# Patient Record
Sex: Female | Born: 1995 | Hispanic: No | Marital: Single | Smoking: Current every day smoker
Health system: Southern US, Community
[De-identification: ages and names within clinical notes are randomized; demographics above are authoritative.]

## PROBLEM LIST (undated history)

## (undated) DIAGNOSIS — R1110 Vomiting, unspecified: Secondary | ICD-10-CM

## (undated) DIAGNOSIS — R51 Headache: Secondary | ICD-10-CM

## (undated) DIAGNOSIS — R1012 Left upper quadrant pain: Secondary | ICD-10-CM

## (undated) DIAGNOSIS — K920 Hematemesis: Secondary | ICD-10-CM

## (undated) HISTORY — DX: Headache: R51

## (undated) HISTORY — DX: Hematemesis: K92.0

## (undated) HISTORY — DX: Left upper quadrant pain: R10.12

## (undated) HISTORY — DX: Vomiting, unspecified: R11.10

---
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
## 2015-07-16 ENCOUNTER — Emergency Department: Payer: Medicaid Other

## 2015-07-16 ENCOUNTER — Emergency Department
Admission: EM | Admit: 2015-07-16 | Discharge: 2015-07-16 | Disposition: A | Discharge status: Home / Self Care | Payer: Medicaid Other | Attending: Emergency Medicine | Admitting: Emergency Medicine

## 2015-07-16 ENCOUNTER — Encounter: Payer: Self-pay | Admitting: *Deleted

## 2015-07-16 DIAGNOSIS — M545 Low back pain: Secondary | ICD-10-CM | POA: Diagnosis not present

## 2015-07-16 DIAGNOSIS — R1032 Left lower quadrant pain: Secondary | ICD-10-CM | POA: Insufficient documentation

## 2015-07-16 DIAGNOSIS — F17200 Nicotine dependence, unspecified, uncomplicated: Secondary | ICD-10-CM | POA: Insufficient documentation

## 2015-07-16 DIAGNOSIS — Z79899 Other long term (current) drug therapy: Secondary | ICD-10-CM | POA: Diagnosis not present

## 2015-07-16 DIAGNOSIS — Z3202 Encounter for pregnancy test, result negative: Secondary | ICD-10-CM | POA: Insufficient documentation

## 2015-07-16 LAB — WET PREP, GENITAL
CLUE CELLS WET PREP: NONE SEEN
SPERM: NONE SEEN
Trich, Wet Prep: NONE SEEN
Yeast Wet Prep HPF POC: NONE SEEN

## 2015-07-16 LAB — URINALYSIS COMPLETE WITH MICROSCOPIC (ARMC ONLY)
BACTERIA UA: NONE SEEN
Bilirubin Urine: NEGATIVE
Glucose, UA: NEGATIVE mg/dL
Hgb urine dipstick: NEGATIVE
KETONES UR: NEGATIVE mg/dL
Leukocytes, UA: NEGATIVE
Nitrite: NEGATIVE
PROTEIN: NEGATIVE mg/dL
Specific Gravity, Urine: 1.01 (ref 1.005–1.030)
pH: 7 (ref 5.0–8.0)

## 2015-07-16 LAB — CBC
HCT: 43.2 % (ref 35.0–47.0)
HEMOGLOBIN: 14.7 g/dL (ref 12.0–16.0)
MCH: 31.3 pg (ref 26.0–34.0)
MCHC: 34.1 g/dL (ref 32.0–36.0)
MCV: 91.8 fL (ref 80.0–100.0)
Platelets: 255 10*3/uL (ref 150–440)
RBC: 4.7 MIL/uL (ref 3.80–5.20)
RDW: 12.7 % (ref 11.5–14.5)
WBC: 7.7 10*3/uL (ref 3.6–11.0)

## 2015-07-16 LAB — BASIC METABOLIC PANEL
ANION GAP: 5 (ref 5–15)
BUN: 8 mg/dL (ref 6–20)
CALCIUM: 9.6 mg/dL (ref 8.9–10.3)
CO2: 30 mmol/L (ref 22–32)
Chloride: 105 mmol/L (ref 101–111)
Creatinine, Ser: 0.64 mg/dL (ref 0.44–1.00)
GFR calc Af Amer: >60 mL/min (ref >60)
GFR calc non Af Amer: >60 mL/min (ref >60)
GLUCOSE: 88 mg/dL (ref 65–99)
Potassium: 3.9 mmol/L (ref 3.5–5.1)
Sodium: 140 mmol/L (ref 135–145)

## 2015-07-16 LAB — CHLAMYDIA/NGC RT PCR (ARMC ONLY)
CHLAMYDIA TR: NOT DETECTED
N gonorrhoeae: NOT DETECTED

## 2015-07-16 LAB — POCT PREGNANCY, URINE: PREG TEST UR: NEGATIVE

## 2015-07-16 MED ORDER — ONDANSETRON 4 MG PO TBDP
ORAL_TABLET | ORAL | Status: AC
  Administered 2015-07-16: 4 mg via ORAL
  Filled 2015-07-16: qty 1

## 2015-07-16 MED ORDER — OXYCODONE-ACETAMINOPHEN 5-325 MG PO TABS
2.0000 | ORAL_TABLET | Freq: Once | ORAL | Status: AC
  Administered 2015-07-16: 2 via ORAL

## 2015-07-16 MED ORDER — OXYCODONE-ACETAMINOPHEN 5-325 MG PO TABS: 1.0000 | ORAL_TABLET | Freq: Four times a day (QID) | ORAL | Status: AC | PRN

## 2015-07-16 MED ORDER — OXYCODONE-ACETAMINOPHEN 5-325 MG PO TABS
ORAL_TABLET | ORAL | Status: AC
  Filled 2015-07-16: qty 2

## 2015-07-16 MED ORDER — ONDANSETRON 4 MG PO TBDP
4.0000 mg | ORAL_TABLET | Freq: Once | ORAL | Status: AC
  Administered 2015-07-16: 4 mg via ORAL

## 2015-07-16 NOTE — ED Provider Notes (Signed)
[facility] Emergency Department Provider Note  Time seen: 3:34 PM  I have reviewed the triage vital signs and the nursing notes.   HISTORY  Chief Complaint Flank Pain    HPI Teresa Wolfe is a 19 y.o. female with a past medical history of headaches, vomiting, who presents the emergency department with left-sided abdominal pain. According to the patient for the past 1 week she's had left lower quadrant pain. She also states lower back pain with states has been ongoing for greater than 1 month. Denies any dysuria, vaginal bleeding, discharge, nausea, vomiting, diarrhea. Describes the pain as moderate to severe, located mostly in the left lower quadrant. Dull/aching pain. States she has had ovarian cysts in the past which this feels very similar to.     Past Medical History  Diagnosis Date  . Headache(784.0)   . LUQ abdominal pain   . Vomiting   . Hematemesis     Patient Active Problem List   Diagnosis Date Noted  . LUQ abdominal pain   . Vomiting   . Hematemesis     History reviewed. No pertinent past surgical history.  Current Outpatient Rx  Name  Route  Sig  Dispense  Refill  . amitriptyline (ELAVIL) 25 MG tablet   Oral   Take 25 mg by mouth at bedtime.           Marland Kitchen escitalopram (LEXAPRO) 10 MG tablet   Oral   Take 10 mg by mouth every morning.           . lamoTRIgine (LAMICTAL) 25 MG tablet   Oral   Take 25 mg by mouth 2 (two) times daily.           Marland Kitchen OLANZapine (ZYPREXA) 10 MG tablet   Oral   Take 10 mg by mouth at bedtime.           Marland Kitchen EXPIRED: omeprazole (PRILOSEC) 40 MG capsule   Oral   Take 1 capsule (40 mg total) by mouth daily.   30 capsule   11   . oxycodone-acetaminophen (LYNOX) 10-300 MG per tablet   Oral   Take 1 tablet by mouth every 8 (eight) hours as needed.             Allergies Review of patient's allergies indicates no known allergies.  No family history on file.  Social History Social History   Substance Use Topics  . Smoking status: Current Every Day Smoker  . Smokeless tobacco: None  . Alcohol Use: No    Review of Systems Constitutional: Negative for fever Cardiovascular: Negative for chest pain. Respiratory: Negative for shortness of breath. Gastrointestinal: Left lower quadrant abdominal pain. Negative for nausea, vomiting, diarrhea. Genitourinary: Negative for dysuria. Negative for hematuria. Negative vaginal bleeding or discharge. Musculoskeletal: Positive lower back pain greater than one month. Neurological: Negative for headache 10-point ROS otherwise negative.  ____________________________________________   PHYSICAL EXAM:  VITAL SIGNS: ED Triage Vitals  Enc Vitals Group     BP 07/16/15 1459 113/67 mmHg     Pulse Rate 07/16/15 1459 63     Resp 07/16/15 1459 20     Temp 07/16/15 1459 98 F (36.7 C)     Temp Source 07/16/15 1459 Oral     SpO2 07/16/15 1459 99 %     Weight 07/16/15 1459 100 lb (45.36 kg)     Height 07/16/15 1459  (1.575 m)     Head Cir --  Peak Flow --      Pain Score 07/16/15 1508 8     Pain Loc --      Pain Edu? --      Excl. in GC? --     Constitutional: Alert and oriented. Well appearing and in no distress. Eyes: Normal exam ENT   Head: Normocephalic and atraumatic   Mouth/Throat: Mucous membranes are moist. Cardiovascular: Normal rate, regular rhythm. No murmur Respiratory: Normal respiratory effort without tachypnea nor retractions. Breath sounds are clear and equal bilaterally. No wheezes/rales/rhonchi. Gastrointestinal: Soft, moderate left lower quadrant abdominal tenderness palpation. No rebound or guarding. No distention. No CVA tenderness. Musculoskeletal: Nontender with normal range of motion in all extremities. Mild lower back tenderness  Neurologic:  Normal speech and language. No gross focal neurologic deficits  Skin:  Skin is warm, dry and intact.  Psychiatric: Mood and affect are normal. Speech and  behavior are normal.  ____________________________________________    INITIAL IMPRESSION / ASSESSMENT AND PLAN / ED COURSE  Pertinent labs & imaging results that were available during my care of the patient were reviewed by me and considered in my medical decision making (see chart for details).  A she presents for 1 week of left lower quadrant abdominal pain which she states feels similar to ovarian cysts in the past. Denies any vaginal bleeding or discharge. Denies dysuria, hematuria, nausea, vomiting, diarrhea, fever. Overall the patient appears very well. Besides the moderate left lower quadrant abdominal tenderness palpation, otherwise she has a benign abdominal exam. No rebound or guarding anywhere. We will check labs, urinalysis, urine pregnancy, and perform a pelvic examination.  Pelvic examination shows very minimal vaginal discharge. Patient does have moderate left adnexal tenderness palpation. No cervical motion tenderness. We'll proceed with an ultrasound to evaluate the patient's left adnexa.  Labs are within normal limits. Ultrasound within normal limits. We'll discharge on short course of pain medication and have her follow up with her primary care physician/OB/GYN. Patient states she has seen [facility] in the past. Patient is agreeable to plan. I discussed return precautions to which she is also agreeable.  ____________________________________________   FINAL CLINICAL IMPRESSION(S) / ED DIAGNOSES  Left lower quadrant abdominal pain   Minna AntisKevin Lauran Romanski, MD 07/16/15 1635

## 2015-07-16 NOTE — ED Notes (Signed)
Pain left flank pain, and back

## 2015-07-16 NOTE — Discharge Instructions (Signed)

## 2015-07-16 NOTE — ED Notes (Signed)
To US via stretcher.  AAOx3. 

## 2015-07-16 NOTE — ED Notes (Signed)
AAOx3.  Skin warm and dry.  AMbulates with easy and steady gait.  Posture upright and relaxed.  NAD

## 2015-08-20 ENCOUNTER — Emergency Department: Payer: Medicaid Other

## 2015-08-20 ENCOUNTER — Emergency Department
Admission: EM | Admit: 2015-08-20 | Discharge: 2015-08-20 | Disposition: A | Discharge status: Home / Self Care | Payer: Medicaid Other | Attending: Emergency Medicine | Admitting: Emergency Medicine

## 2015-08-20 ENCOUNTER — Encounter: Payer: Self-pay | Admitting: Emergency Medicine

## 2015-08-20 DIAGNOSIS — W208XXA Other cause of strike by thrown, projected or falling object, initial encounter: Secondary | ICD-10-CM | POA: Insufficient documentation

## 2015-08-20 DIAGNOSIS — Y998 Other external cause status: Secondary | ICD-10-CM | POA: Insufficient documentation

## 2015-08-20 DIAGNOSIS — S20212A Contusion of left front wall of thorax, initial encounter: Secondary | ICD-10-CM | POA: Insufficient documentation

## 2015-08-20 DIAGNOSIS — Z79899 Other long term (current) drug therapy: Secondary | ICD-10-CM | POA: Insufficient documentation

## 2015-08-20 DIAGNOSIS — F17200 Nicotine dependence, unspecified, uncomplicated: Secondary | ICD-10-CM | POA: Diagnosis not present

## 2015-08-20 DIAGNOSIS — Y9389 Activity, other specified: Secondary | ICD-10-CM | POA: Diagnosis not present

## 2015-08-20 DIAGNOSIS — S29001A Unspecified injury of muscle and tendon of front wall of thorax, initial encounter: Secondary | ICD-10-CM | POA: Diagnosis present

## 2015-08-20 DIAGNOSIS — Y9289 Other specified places as the place of occurrence of the external cause: Secondary | ICD-10-CM | POA: Diagnosis not present

## 2015-08-20 MED ORDER — KETOROLAC TROMETHAMINE 60 MG/2ML IM SOLN
30.0000 mg | Freq: Once | INTRAMUSCULAR | Status: AC
  Administered 2015-08-20: 30 mg via INTRAMUSCULAR
  Filled 2015-08-20: qty 2

## 2015-08-20 MED ORDER — TRAMADOL HCL 50 MG PO TABS: 50.0000 mg | ORAL_TABLET | Freq: Four times a day (QID) | ORAL | Status: AC | PRN

## 2015-08-20 NOTE — ED Notes (Signed)
Patient transported to X-ray 
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
## 2015-08-20 NOTE — ED Notes (Signed)
Pt reports left rib pain x3 days, reports a dresser fell on them the other day. Pt reports pain upon deep breath, no difficulty or distress noted at this time.

## 2015-08-20 NOTE — Discharge Instructions (Signed)
Rib Contusion A rib contusion is a deep bruise on your rib area. Contusions are the result of a blunt trauma that causes bleeding and injury to the tissues under the skin. A rib contusion may involve bruising of the ribs and of the skin and muscles in the area. The skin overlying the contusion may turn blue, purple, or yellow. Minor injuries will give you a painless contusion, but more severe contusions may stay painful and swollen for a few weeks. CAUSES  A contusion is usually caused by a blow, trauma, or direct force to an area of the body. This often occurs while playing contact sports. SYMPTOMS  Swelling and redness of the injured area.  Discoloration of the injured area.  Tenderness and soreness of the injured area.  Pain with or without movement. DIAGNOSIS  The diagnosis can be made by taking a medical history and performing a physical exam. An X-ray, CT scan, or MRI may be needed to determine if there were any associated injuries, such as broken bones (fractures) or internal injuries. TREATMENT  Often, the best treatment for a rib contusion is rest. Icing or applying cold compresses to the injured area may help reduce swelling and inflammation. Deep breathing exercises may be recommended to reduce the risk of partial lung collapse and pneumonia. Over-the-counter or prescription medicines may also be recommended for pain control. HOME CARE INSTRUCTIONS   Apply ice to the injured area:  Put ice in a plastic bag.  Place a towel between your skin and the bag.  Leave the ice on for 20 minutes, 2-3 times per day.  Take medicines only as directed by your health care provider.  Rest the injured area. Avoid strenuous activity and any activities or movements that cause pain. Be careful during activities and avoid bumping the injured area.  Perform deep-breathing exercises as directed by your health care provider.  Do not lift anything that is heavier than 5 lb (2.3 kg) until your  health care provider approves.  Do not use any tobacco products, including cigarettes, chewing tobacco, or electronic cigarettes. If you need help quitting, ask your health care provider. SEEK MEDICAL CARE IF:   You have increased bruising or swelling.  You have pain that is not controlled with treatment.  You have a fever. SEEK IMMEDIATE MEDICAL CARE IF:   You have difficulty breathing or shortness of breath.  You develop a continual cough, or you cough up thick or bloody sputum.  You feel sick to your stomach (nauseous), you throw up (vomit), or you have abdominal pain.   This information is not intended to replace advice given to you by your health care provider. Make sure you discuss any questions you have with your health care provider.   Document Released: 03/29/2001 Document Revised: 07/25/2014 Document Reviewed: 04/15/2014 Elsevier Interactive Patient Education 2016 [employer]  Cryotherapy Cryotherapy is when you put ice on your injury. Ice helps lessen pain and puffiness (swelling) after an injury. Ice works the best when you start using it in the first 24 to 48 hours after an injury. HOME CARE  Put a dry or damp towel between the ice pack and your skin.  You may press gently on the ice pack.  Leave the ice on for no more than 10 to 20 minutes at a time.  Check your skin after 5 minutes to make sure your skin is okay.  Rest at least 20 minutes between ice pack uses.  Stop using ice when your skin  loses feeling (numbness).  Do not use ice on someone who cannot tell you when it hurts. This includes small children and people with memory problems (dementia). GET HELP RIGHT AWAY IF:  You have white spots on your skin.  Your skin turns blue or pale.  Your skin feels waxy or hard.  Your puffiness gets worse. MAKE SURE YOU:   Understand these instructions.  Will watch your condition.  Will get help right away if you are not doing well or get worse.   This  information is not intended to replace advice given to you by your health care provider. Make sure you discuss any questions you have with your health care provider.   Document Released: 12/21/2007 Document Revised: 09/26/2011 Document Reviewed: 02/24/2011 Elsevier Interactive Patient Education [employer]

## 2015-08-20 NOTE — ED Provider Notes (Signed)
[facility] Emergency Department Provider Note  ____________________________________________  Time seen: Approximately 6:13 PM  I have reviewed the triage vital signs and the nursing notes.   HISTORY  Chief Complaint Rib Injury    HPI Teresa Wolfe is a 20 y.o. female, NAD, presents to the emergency department with 3 day history of left lower rib pain. States she and her roommate were moving a Child psychotherapist when it slipped and hit her about the left side. Has had point tenderness left lateral, lower rib area. Is taking Aleve over-the-counter without significant relief. Notes she has been laying in bed as it hurts to move. Some pain with deep breaths but no shortness of breath. Denies any cardiac chest pain or headache. Denies open wounds or lacerations to the area.   Past Medical History  Diagnosis Date  . Headache(784.0)   . LUQ abdominal pain   . Vomiting   . Hematemesis     Patient Active Problem List   Diagnosis Date Noted  . LUQ abdominal pain   . Vomiting   . Hematemesis     History reviewed. No pertinent past surgical history.  Current Outpatient Rx  Name  Route  Sig  Dispense  Refill  . amitriptyline (ELAVIL) 25 MG tablet   Oral   Take 25 mg by mouth at bedtime.           Marland Kitchen escitalopram (LEXAPRO) 10 MG tablet   Oral   Take 10 mg by mouth every morning.           . lamoTRIgine (LAMICTAL) 25 MG tablet   Oral   Take 25 mg by mouth 2 (two) times daily.           Marland Kitchen OLANZapine (ZYPREXA) 10 MG tablet   Oral   Take 10 mg by mouth at bedtime.           Marland Kitchen EXPIRED: omeprazole (PRILOSEC) 40 MG capsule   Oral   Take 1 capsule (40 mg total) by mouth daily.   30 capsule   11   . oxyCODONE-acetaminophen (ROXICET) 5-325 MG tablet   Oral   Take 1 tablet by mouth every 6 (six) hours as needed.   12 tablet   0   . traMADol (ULTRAM) 50 MG tablet   Oral   Take 1 tablet (50 mg total) by mouth every 6 (six) hours as needed.   12 tablet    0     Allergies Review of patient's allergies indicates no known allergies.  No family history on file.  Social History Social History  Substance Use Topics  . Smoking status: Current Every Day Smoker  . Smokeless tobacco: None  . Alcohol Use: No     Review of Systems  Constitutional: No fever, chills, fatigue.  Cardiovascular: No chest pain. Respiratory: No cough. No shortness of breath. No wheezing.  Gastrointestinal: No abdominal pain.  No nausea, vomiting.   Musculoskeletal: Positive for left lower rib pain. Negative for back pain.  Skin: Negative for rash, redness, ecchymosis, lacerations.  Neurological: Negative for headaches, focal weakness or numbness. 10-point ROS otherwise negative.  ____________________________________________   PHYSICAL EXAM:  VITAL SIGNS: ED Triage Vitals  Enc Vitals Group     BP 08/20/15 1807 117/68 mmHg     Pulse Rate 08/20/15 1807 78     Resp 08/20/15 1807 16     Temp 08/20/15 1807 97.9 F (36.6 C)     Temp Source 08/20/15 1807 Oral  SpO2 08/20/15 1807 99 %     Weight 08/20/15 1807 100 lb (45.36 kg)     Height 08/20/15 1807  (1.575 m)     Head Cir --      Peak Flow --      Pain Score 08/20/15 1808 10     Pain Loc --      Pain Edu? --      Excl. in GC? --     Constitutional: Alert and oriented. Well appearing and in no acute distress. Eyes: Conjunctivae are normal.  Cardiovascular: Normal rate, regular rhythm. Normal S1 and S2.   Respiratory: Normal respiratory effort without tachypnea or retractions. Lungs CTAB. Gastrointestinal: Soft and nontender. No distention.  Musculoskeletal: Tenderness to palpation over the left lateral lower rib line. Neurologic:  Normal speech and language. No gross focal neurologic deficits are appreciated.  Skin:  Faint blue ecchymosis noted left lower rib line. Skin is warm, dry and intact. No rash noted. Psychiatric: Mood and affect are normal. Speech and behavior are normal. Patient  exhibits appropriate insight and judgement.   ____________________________________________   LABS  None   ____________________________________________  EKG  None ____________________________________________  RADIOLOGY I have personally viewed and evaluated these images (plain radiographs) as part of my medical decision making, as well as reviewing the written report by the radiologist.  Dg Ribs Unilateral W/chest Left  08/20/2015  CLINICAL DATA:  Left anterior rib pain after injury. Moving a dresser, was pinned against the wall after it was dropped. EXAM: LEFT RIBS AND CHEST - 3+ VIEW COMPARISON:  None. FINDINGS: The cortical margins of the left ribs are intact. No fracture or destructive rib lesion. The cardiomediastinal contours are normal. The lungs are clear. Pulmonary vasculature is normal. No consolidation, pleural effusion, or pneumothorax. IMPRESSION: Negative.  No rib fracture or acute intrathoracic process. Electronically Signed   By: Rubye Oaks M.D.   On: 08/20/2015 18:44    ____________________________________________    PROCEDURES  Procedure(s) performed: None    Medications  ketorolac (TORADOL) injection 30 mg (30 mg Intramuscular Given 08/20/15 1842)     ____________________________________________   INITIAL IMPRESSION / ASSESSMENT AND PLAN / ED COURSE  Pertinent labs & imaging results that were available during my care of the patient were reviewed by me and considered in my medical decision making (see chart for details).  Patient's diagnosis is consistent with contusion of left lower rib as xray showed no fracture. Patient will be discharged home with prescriptions for Ultram to use as needed for severe pain and should use sparingly. Advise taking Tylenol 500-1,000 mg every 8 hours as needed for pain.  Ice to the affected area 3-4 times daily 20 minutes as needed. Patient is to follow up with primary care if symptoms persist past this treatment  course. Patient is given ED precautions to return to the ED for any worsening or new symptoms.    ____________________________________________  FINAL CLINICAL IMPRESSION(S) / ED DIAGNOSES  Final diagnoses:  Contusion of rib, left, initial encounter      NEW MEDICATIONS STARTED DURING THIS VISIT:  New Prescriptions   TRAMADOL (ULTRAM) 50 MG TABLET    Take 1 tablet (50 mg total) by mouth every 6 (six) hours as needed.         Hope Pigeon, PA-C 08/20/15 1905  Phineas Semen, MD 08/20/15 [phone]

## 2016-02-16 ENCOUNTER — Emergency Department
Admission: EM | Admit: 2016-02-16 | Discharge: 2016-02-16 | Disposition: A | Discharge status: Home / Self Care | Payer: Medicaid Other | Attending: Emergency Medicine | Admitting: Emergency Medicine

## 2016-02-16 ENCOUNTER — Emergency Department: Payer: Medicaid Other

## 2016-02-16 ENCOUNTER — Encounter: Payer: Self-pay | Admitting: Emergency Medicine

## 2016-02-16 DIAGNOSIS — F17200 Nicotine dependence, unspecified, uncomplicated: Secondary | ICD-10-CM | POA: Insufficient documentation

## 2016-02-16 DIAGNOSIS — Y929 Unspecified place or not applicable: Secondary | ICD-10-CM | POA: Insufficient documentation

## 2016-02-16 DIAGNOSIS — W230XXA Caught, crushed, jammed, or pinched between moving objects, initial encounter: Secondary | ICD-10-CM | POA: Insufficient documentation

## 2016-02-16 DIAGNOSIS — S60111A Contusion of right thumb with damage to nail, initial encounter: Secondary | ICD-10-CM | POA: Insufficient documentation

## 2016-02-16 DIAGNOSIS — Y999 Unspecified external cause status: Secondary | ICD-10-CM | POA: Insufficient documentation

## 2016-02-16 DIAGNOSIS — Y939 Activity, unspecified: Secondary | ICD-10-CM | POA: Insufficient documentation

## 2016-02-16 DIAGNOSIS — S6701XA Crushing injury of right thumb, initial encounter: Secondary | ICD-10-CM

## 2016-02-16 MED ORDER — HYDROCODONE-ACETAMINOPHEN 5-325 MG PO TABS: 1.0000 | ORAL_TABLET | ORAL | 0 refills | Status: DC | PRN

## 2016-02-16 MED ORDER — HYDROCODONE-ACETAMINOPHEN 5-325 MG PO TABS
1.0000 | ORAL_TABLET | Freq: Once | ORAL | Status: AC
  Administered 2016-02-16: 1 via ORAL
  Filled 2016-02-16: qty 1

## 2016-02-16 NOTE — ED Triage Notes (Signed)
Smashed right thumb in car door last pm

## 2016-02-16 NOTE — ED Provider Notes (Signed)
[facility] Emergency Department Provider Note  ____________________________________________   None    (approximate)  I have reviewed the triage vital signs and the nursing notes.   HISTORY  Chief Complaint Finger Injury   HPI Teresa Wolfe is a 20 y.o. female who presents today for evaluation of right thumb pain. Patient states that last night she had her thumb slammed in the car door. Patient with restricted range of motion, 1st digit without extreme pain. Patient is experiencing 9/10 pain. She denies numbness and tingling in to the right hand.    Past Medical History:  Diagnosis Date  . Headache(784.0)   . Hematemesis   . LUQ abdominal pain   . Vomiting     Patient Active Problem List   Diagnosis Date Noted  . LUQ abdominal pain   . Vomiting   . Hematemesis     History reviewed. No pertinent surgical history.  Prior to Admission medications   Medication Sig Start Date End Date Taking? Authorizing Provider  amitriptyline (ELAVIL) 25 MG tablet Take 25 mg by mouth at bedtime.   11/09/10   Historical Provider, MD  escitalopram (LEXAPRO) 10 MG tablet Take 10 mg by mouth every morning.   11/09/10   Historical Provider, MD  HYDROcodone-acetaminophen (NORCO/VICODIN) 5-325 MG tablet Take 1 tablet by mouth every 4 (four) hours as needed for moderate pain. 02/16/16   Tommi Rumps, PA-C  lamoTRIgine (LAMICTAL) 25 MG tablet Take 25 mg by mouth 2 (two) times daily.   11/09/10   Historical Provider, MD  OLANZapine (ZYPREXA) 10 MG tablet Take 10 mg by mouth at bedtime.   11/09/10   Historical Provider, MD  omeprazole (PRILOSEC) 40 MG capsule Take 1 capsule (40 mg total) by mouth daily. 12/16/10 12/16/11  Jon Gills, MD  oxyCODONE-acetaminophen (ROXICET) 5-325 MG tablet Take 1 tablet by mouth every 6 (six) hours as needed. 07/16/15   Minna Antis, MD  traMADol (ULTRAM) 50 MG tablet Take 1 tablet (50 mg total) by mouth every 6 (six) hours as needed. 08/20/15  08/19/16  Jami L Hagler, PA-C    Allergies Review of patient's allergies indicates no known allergies.  No family history on file.  Social History Social History  Substance Use Topics  . Smoking status: Current Every Day Smoker  . Smokeless tobacco: Never Used  . Alcohol use No    Review of Systems Constitutional: No fever/chills Cardiovascular: Denies chest pain. Respiratory: Denies shortness of breath. Musculoskeletal: Negative for back pain. Pain to the right first digit, pt ref Skin: Negative for rash. Neurological: Negative for headaches  10-point ROS otherwise negative.  ____________________________________________   PHYSICAL EXAM:  VITAL SIGNS: ED Triage Vitals  Enc Vitals Group     BP 02/16/16 1521 117/68     Pulse Rate 02/16/16 1521 (!) 102     Resp 02/16/16 1521 16     Temp 02/16/16 1521 98 F (36.7 C)     Temp src --      SpO2 02/16/16 1521 99 %     Weight --      Height --      Head Circumference --      Peak Flow --      Pain Score 02/16/16 1522 9     Pain Loc --      Pain Edu? --      Excl. in GC? --     Constitutional: Alert and oriented. Well appearing and in no acute distress.  Eyes: Conjunctivae are normal. PERRL. EOMI. Head: Atraumatic. Nose: No congestion/rhinnorhea. Neck: No stridor.   Cardiovascular: Normal rate, regular rhythm. Grossly normal heart sounds.  Good peripheral circulation. Respiratory: Normal respiratory effort.  No retractions. Lungs CTAB. Musculoskeletal: Examination the right thumb there is no gross deformity however there is a subungual hematoma present. There is some slight edema however patient is unwilling to allow provider to touch the digit for any other exam. Patient is able to flex and extend but guards any movement to her thumb due to pain. Neurologic:  Normal speech and language. No gross focal neurologic deficits are appreciated. No gait instability. Skin:  Skin is warm, dry and intact. No abrasions or  ecchymosis of the skin was noted. Psychiatric: Mood and affect are normal. Speech and behavior are normal.  ____________________________________________   LABS (all labs ordered are listed, but only abnormal results are displayed)  Labs Reviewed - No data to display  RADIOLOGY  Right thumb no fracture or dislocation per radiologist. ____________________________________________   PROCEDURES  Procedure(s) performed: None  Procedures  Critical Care performed: No  ____________________________________________   INITIAL IMPRESSION / ASSESSMENT AND PLAN / ED COURSE  Pertinent labs & imaging results that were available during my care of the patient were reviewed by me and considered in my medical decision making (see chart for details).  Patient refused to have nail evacuated from the subungual hematoma. Even with the offer of digital block, patient refused. Patient was given Norco as needed for severe pain. She was told to ice and elevate as needed and that she could follow up with her primary care doctor or [facility] clinic if any continued problems.  Clinical Course     ____________________________________________   FINAL CLINICAL IMPRESSION(S) / ED DIAGNOSES  Final diagnoses:  Crushing injury of right thumb, initial encounter  Subungual hematoma of right thumb, initial encounter      NEW MEDICATIONS STARTED DURING THIS VISIT:  Discharge Medication List as of 02/16/2016  4:57 PM       Note:  This document was prepared using Dragon voice recognition software and may include unintentional dictation errors.    Tommi Rumps, PA-C 02/16/16 1753    Governor Rooks, MD 02/16/16 [phone]

## 2016-02-16 NOTE — ED Notes (Signed)
See triage note...circulation and sensation intact.  Pt sts ibuprofen/ice has not relieved pain.

## 2016-02-16 NOTE — Discharge Instructions (Signed)
Follow-up with [facility] clinic if any continued problems. Ice and elevate as needed for pain and swelling. Norco as needed for severe pain. Also take over-the-counter ibuprofen as needed for inflammation

## 2016-02-24 ENCOUNTER — Emergency Department: Payer: Self-pay

## 2016-02-24 ENCOUNTER — Encounter: Payer: Self-pay | Admitting: *Deleted

## 2016-02-24 DIAGNOSIS — W230XXA Caught, crushed, jammed, or pinched between moving objects, initial encounter: Secondary | ICD-10-CM | POA: Insufficient documentation

## 2016-02-24 DIAGNOSIS — Y92000 Kitchen of unspecified non-institutional (private) residence as  the place of occurrence of the external cause: Secondary | ICD-10-CM | POA: Insufficient documentation

## 2016-02-24 DIAGNOSIS — Y999 Unspecified external cause status: Secondary | ICD-10-CM | POA: Insufficient documentation

## 2016-02-24 DIAGNOSIS — Y939 Activity, unspecified: Secondary | ICD-10-CM | POA: Insufficient documentation

## 2016-02-24 DIAGNOSIS — F17200 Nicotine dependence, unspecified, uncomplicated: Secondary | ICD-10-CM | POA: Insufficient documentation

## 2016-02-24 DIAGNOSIS — S60221A Contusion of right hand, initial encounter: Secondary | ICD-10-CM | POA: Insufficient documentation

## 2016-02-24 NOTE — ED Triage Notes (Signed)
Pt has pain in right hand.  Pt slammed car door onto right hand tonight.  Abrasion to right hand.

## 2016-02-25 ENCOUNTER — Emergency Department
Admission: EM | Admit: 2016-02-25 | Discharge: 2016-02-25 | Disposition: A | Discharge status: Home / Self Care | Payer: Self-pay | Attending: Emergency Medicine | Admitting: Emergency Medicine

## 2016-02-25 DIAGNOSIS — S60221A Contusion of right hand, initial encounter: Secondary | ICD-10-CM

## 2016-02-25 MED ORDER — HYDROCODONE-ACETAMINOPHEN 5-325 MG PO TABS: 1.0000 | ORAL_TABLET | ORAL | 0 refills | Status: AC | PRN

## 2016-02-25 MED ORDER — OXYCODONE-ACETAMINOPHEN 5-325 MG PO TABS
2.0000 | ORAL_TABLET | Freq: Once | ORAL | Status: AC
  Administered 2016-02-25: 2 via ORAL
  Filled 2016-02-25: qty 2

## 2016-02-25 NOTE — ED Notes (Signed)
Discharge instructions reviewed with patient. Questions fielded by this RN. Patient verbalizes understanding of instructions. [facility] No acute distress noted at time of discharge.

## 2016-02-25 NOTE — ED Provider Notes (Signed)
[facility] Emergency Department Provider Note  ____________________________________________   First MD Initiated Contact with Patient 02/25/16 0225     (approximate)  I have reviewed the triage vital signs and the nursing notes.   HISTORY  Chief Complaint Hand Injury    HPI Teresa Wolfe is a 20 y.o. female who was seen approximately 9 days ago after slamming her right thumb in a car door.  She presents tonight for evaluation of pain of her entire right hand after slamming it in the door of her kitchen.  She reports that she was angry and not paying attention and accidentally got her hand caught in the door.  She reports the pain is severe, aching, and sharp.  It is worse with any movement of her hand.  There is some swelling and discoloration as well.  She sustained no other injuries.  She indicates that she did it to herself by accident.  Her boyfriend is present and she does not seem upset, scared, and indicates that she is not in any danger and is insistent that no one did it to her.She is right-hand dominant.   Past Medical History:  Diagnosis Date  . Headache(784.0)   . Hematemesis   . LUQ abdominal pain   . Vomiting     Patient Active Problem List   Diagnosis Date Noted  . LUQ abdominal pain   . Vomiting   . Hematemesis     No past surgical history on file.  Prior to Admission medications   Medication Sig Start Date End Date Taking? Authorizing Provider  amitriptyline (ELAVIL) 25 MG tablet Take 25 mg by mouth at bedtime.   11/09/10   Historical Provider, MD  escitalopram (LEXAPRO) 10 MG tablet Take 10 mg by mouth every morning.   11/09/10   Historical Provider, MD  HYDROcodone-acetaminophen (NORCO/VICODIN) 5-325 MG tablet Take 1-2 tablets by mouth every 4 (four) hours as needed for moderate pain. 02/25/16   Loleta Rose, MD  lamoTRIgine (LAMICTAL) 25 MG tablet Take 25 mg by mouth 2 (two) times daily.   11/09/10   Historical Provider, MD    OLANZapine (ZYPREXA) 10 MG tablet Take 10 mg by mouth at bedtime.   11/09/10   Historical Provider, MD  omeprazole (PRILOSEC) 40 MG capsule Take 1 capsule (40 mg total) by mouth daily. 12/16/10 12/16/11  Jon Gills, MD  oxyCODONE-acetaminophen (ROXICET) 5-325 MG tablet Take 1 tablet by mouth every 6 (six) hours as needed. 07/16/15   Minna Antis, MD  traMADol (ULTRAM) 50 MG tablet Take 1 tablet (50 mg total) by mouth every 6 (six) hours as needed. 08/20/15 08/19/16  Jami L Hagler, PA-C    Allergies Review of patient's allergies indicates no known allergies.  No family history on file.  Social History Social History  Substance Use Topics  . Smoking status: Current Every Day Smoker  . Smokeless tobacco: Never Used  . Alcohol use No    Review of Systems Constitutional: No fever/chills Cardiovascular: Denies chest pain. Respiratory: Denies shortness of breath. Gastrointestinal: No abdominal pain.  No nausea, no vomiting.  No diarrhea.  No constipation. Genitourinary: Negative for dysuria. Musculoskeletal: Pain and swelling in right hand Skin: Negative for rash. Neurological: Negative for headaches, focal weakness or numbness.  ____________________________________________   PHYSICAL EXAM:  VITAL SIGNS: ED Triage Vitals  Enc Vitals Group     BP 02/24/16 2332 111/71     Pulse Rate 02/24/16 2332 96     Resp 02/24/16  2332 16     Temp 02/24/16 2332 98.5 F (36.9 C)     Temp Source 02/24/16 2332 Oral     SpO2 02/24/16 2332 99 %     Weight 02/24/16 2332 100 lb (45.4 kg)     Height 02/24/16 2332 5\' 2"  (1.575 m)     Head Circumference --      Peak Flow --      Pain Score 02/24/16 2333 8     Pain Loc --      Pain Edu? --      Excl. in GC? --     Constitutional: Alert and oriented. Well appearing and in no acute distress. Eyes: Conjunctivae are normal. PERRL. EOMI. Head: Atraumatic. Neck: No stridor.  No meningeal signs.   Cardiovascular: Normal rate, regular rhythm.  Good peripheral circulation.  Respiratory: Normal respiratory effort.  No retractions.  Gastrointestinal: Soft and nontender. No distention.  Musculoskeletal: Swelling and ecchymosis to dorsal aspect of right hand most notable on the ulnar side.  Subacute injury to the right thumb with  A subungual hematoma that has reportedly been there for 9 days.  Normal capillary refill.  N/V intact. Neurologic:  Normal speech and language. No gross focal neurologic deficits are appreciated.  Skin:  Skin is warm, dry and intact. No rash noted. Psychiatric: Mood and affect are normal. Speech and behavior are normal.  ____________________________________________   LABS (all labs ordered are listed, but only abnormal results are displayed)  Labs Reviewed - No data to display ____________________________________________  EKG  None ____________________________________________  RADIOLOGY   Dg Hand Complete Right  Result Date: 02/25/2016 CLINICAL DATA:  Acute onset of pain, swelling and bruising about the right hand, after slamming car door into right hand. Unable to move fingers. Initial encounter. EXAM: RIGHT HAND - COMPLETE 3+ VIEW COMPARISON:  None. FINDINGS: There is no evidence of fracture or dislocation. The joint spaces are preserved. The carpal rows are intact, and demonstrate normal alignment. Diffuse dorsal soft tissue swelling is noted about the hand. IMPRESSION: No evidence of fracture or dislocation. Electronically Signed   By: Roanna RaiderJeffery  Chang M.D.   On: 02/25/2016 00:20    ____________________________________________   PROCEDURES  Procedure(s) performed:   Procedures   Critical Care performed: No ____________________________________________   INITIAL IMPRESSION / ASSESSMENT AND PLAN / ED COURSE  Pertinent labs & imaging results that were available during my care of the patient were reviewed by me and considered in my medical decision making (see chart for details).  No  fractures or dislocations.  Significant contusion to the hand and I am providing a removable splint for comfort.  No indication of domestic violence or abuse.  I am giving her the name and number of orthopedics with whom she can follow-up if necessary.  I reviewed the patient's prescription history over the last 12 months in the [place] Controlled Substances Database, and she has a few prescriptions filled including one 9 days ago but does not seem to have an abnormal number of prescriptions.  I will give her a short course of narcotics given how painful her hand appears.  Clinical Course    ____________________________________________  FINAL CLINICAL IMPRESSION(S) / ED DIAGNOSES  Final diagnoses:  Hand contusion, right, initial encounter     MEDICATIONS GIVEN DURING THIS VISIT:  Medications  oxyCODONE-acetaminophen (PERCOCET/ROXICET) 5-325 MG per tablet 2 tablet (2 tablets Oral Given 02/25/16 0240)     NEW OUTPATIENT MEDICATIONS STARTED DURING THIS VISIT:  New Prescriptions  HYDROCODONE-ACETAMINOPHEN (NORCO/VICODIN) 5-325 MG TABLET    Take 1-2 tablets by mouth every 4 (four) hours as needed for moderate pain.      Note:  This document was prepared using Dragon voice recognition software and may include unintentional dictation errors.    Loleta Rose, MD 02/25/16 [phone]

## 2017-09-11 IMAGING — US US TRANSVAGINAL NON-OB
1 series · 14 of 25 positions shown · non-contrast
Comparison: 10/04/2012

CLINICAL DATA: Left-sided pelvic pain for 1 month



[Series 1: us transvaginal non-ob · 0.24mm/px · 14 of 70 slices shown]
[im 1/70]
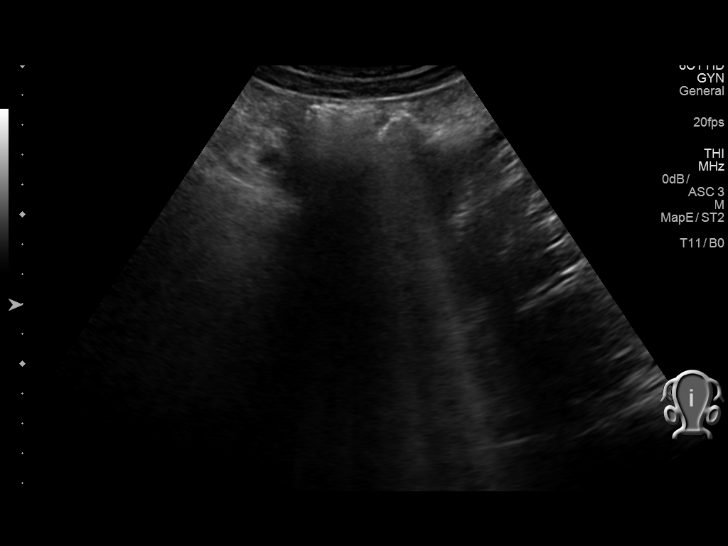
[im 6/70]
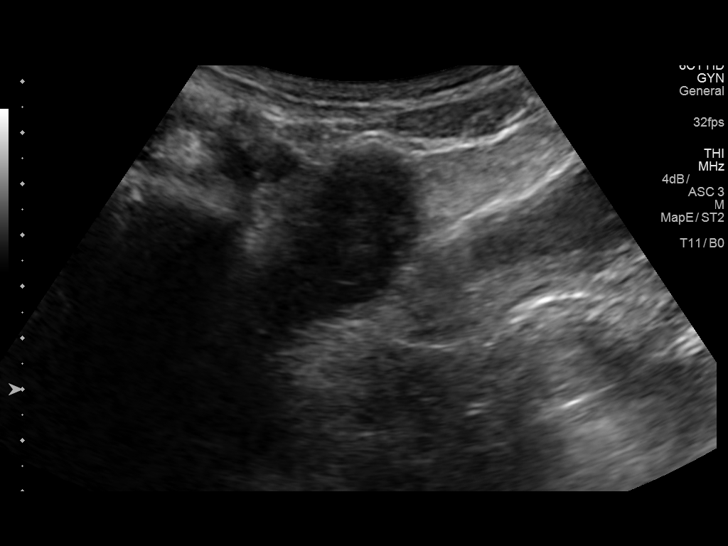
[im 12/70]
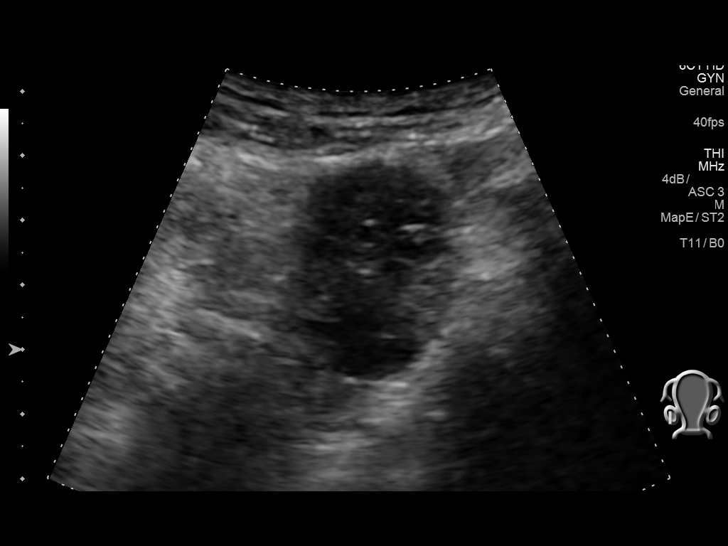
[im 18/70]
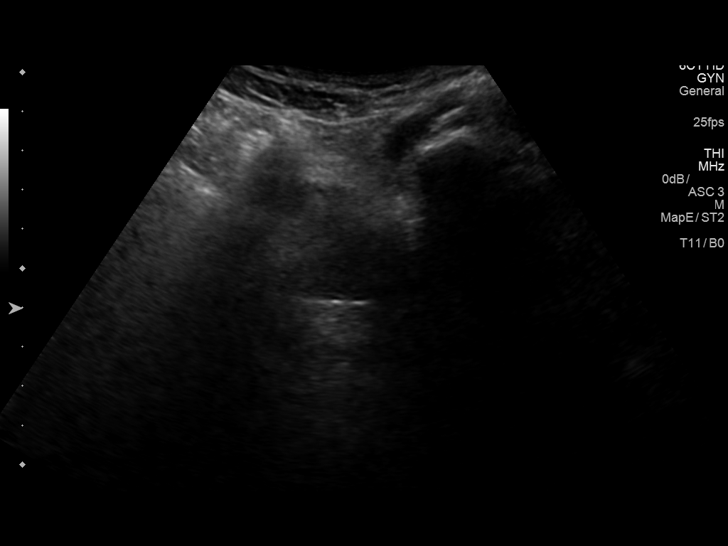
[im 24/70]
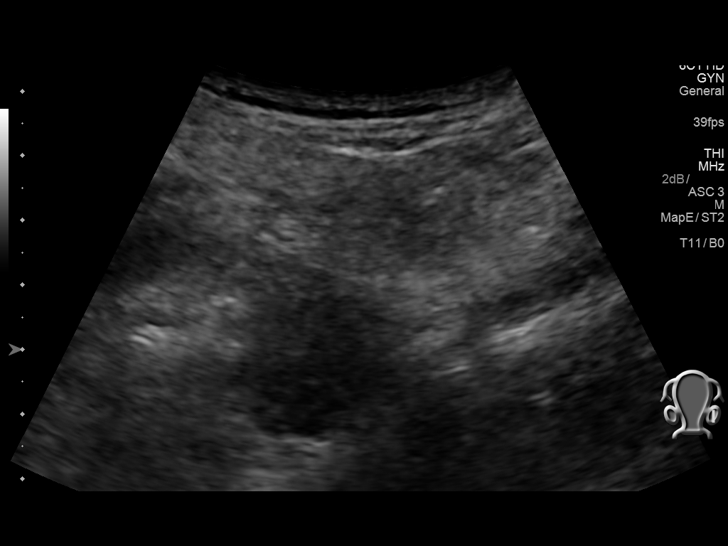
[im 26/70]
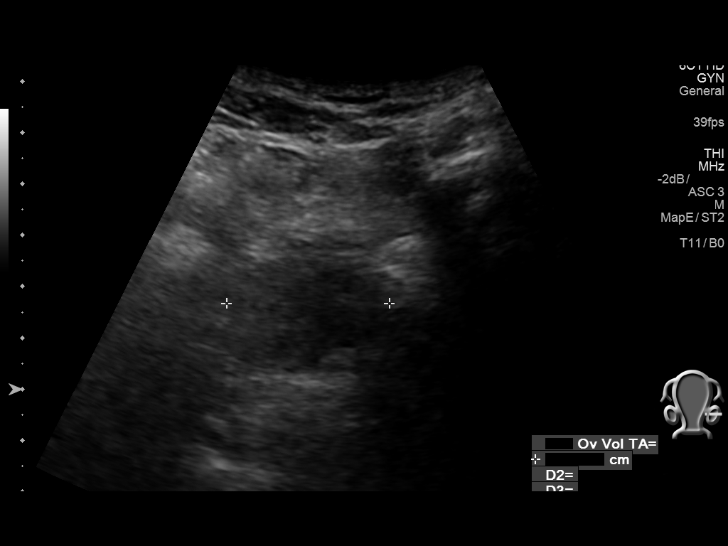
[im 32/70]
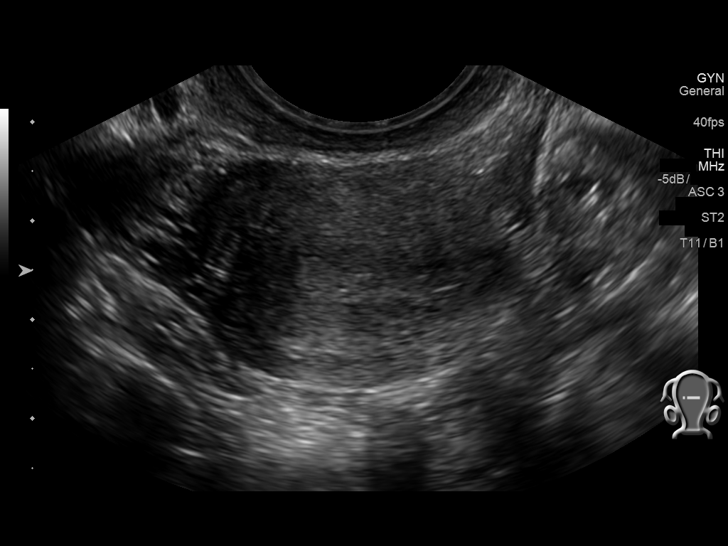
[im 38/70]
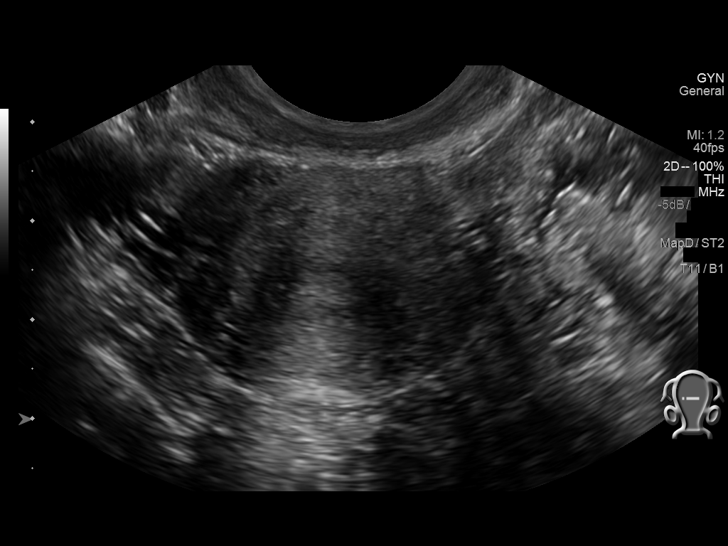
[im 44/70]
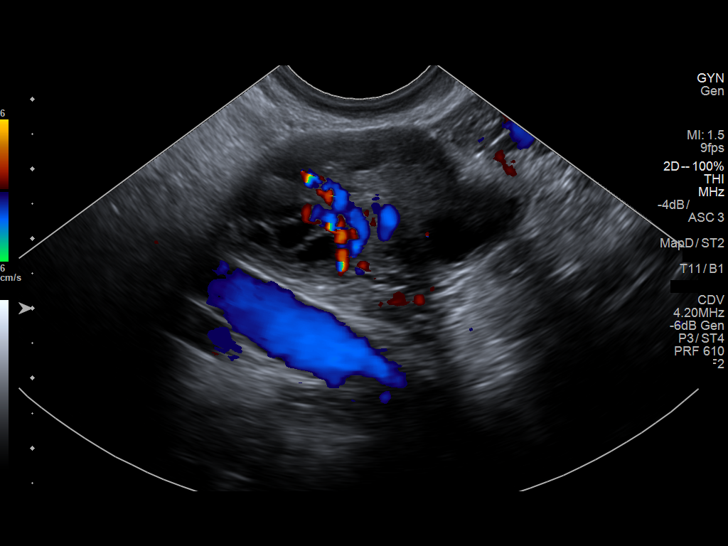
[im 47/70]
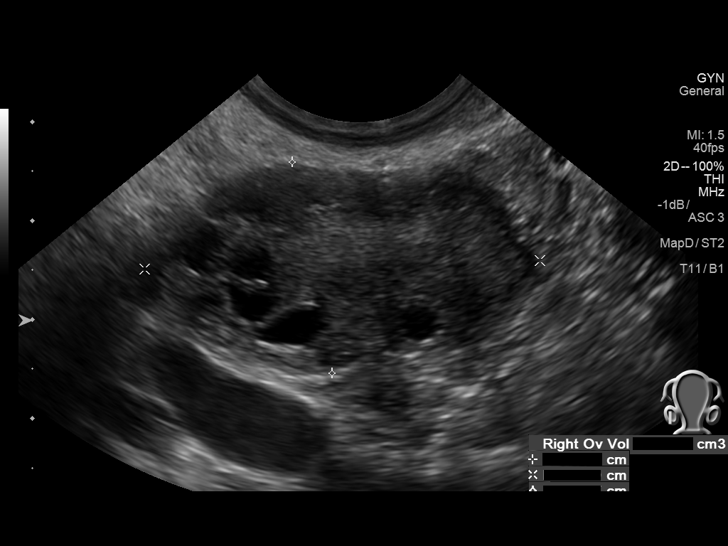
[im 52/70]
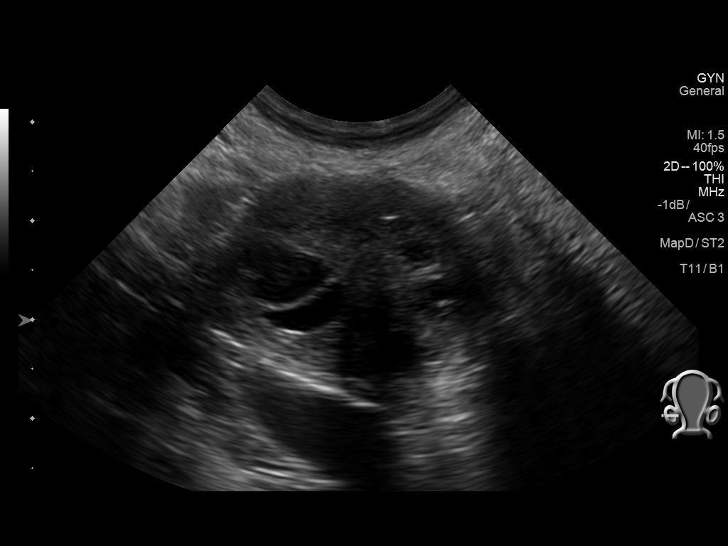
[im 58/70]
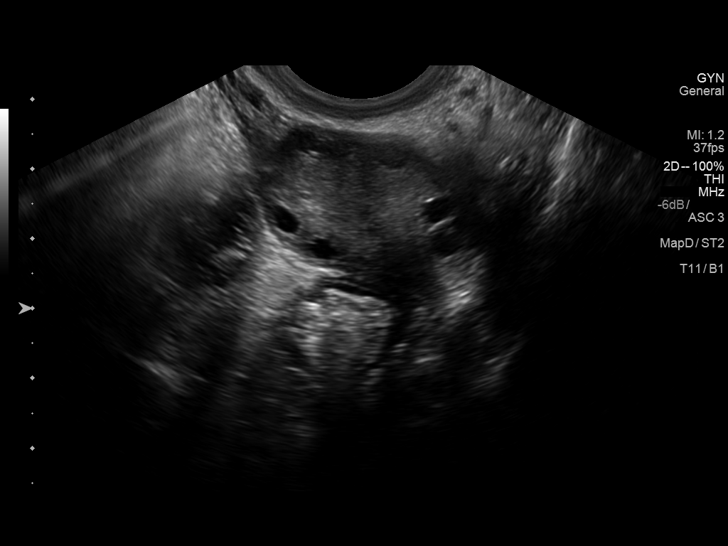
[im 64/70]
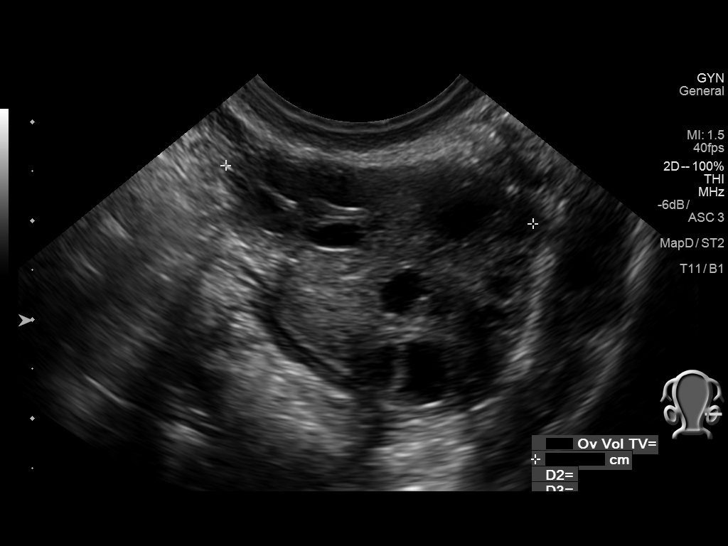
[im 70/70]
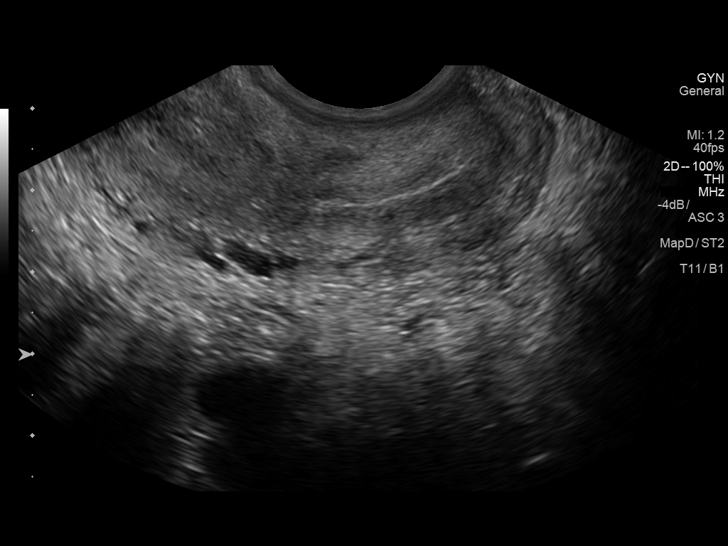

[14 of 25 positions shown; findings below may reference images not displayed]

FINDINGS: Uterus

Measurements: 6.0 x 2.5 x 3.3 cm.. No fibroids or other mass
visualized.

Endometrium

Thickness: 5.5 mm..  No focal abnormality visualized.

Right ovary

Measurements: 4.0 x 2.2 x 2.5 cm.. Multiple follicles are noted.
Normal blood flow is noted.

Left ovary

Measurements: 3.5 x 2.8 x 3.2 cm.. Multiple follicles are noted.
Normal blood flow is noted.

Other findings

No abnormal free fluid.
IMPRESSION: No acute abnormality is noted.
